# Patient Record
Sex: Female | Born: 1995 | Race: White | Hispanic: No | Marital: Single | Smoking: Never smoker
Health system: Southern US, Community
[De-identification: ages and names within clinical notes are randomized; demographics above are authoritative.]

## PROBLEM LIST (undated history)

## (undated) DIAGNOSIS — R011 Cardiac murmur, unspecified: Secondary | ICD-10-CM

---
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
## 2017-05-27 ENCOUNTER — Emergency Department (HOSPITAL_BASED_OUTPATIENT_CLINIC_OR_DEPARTMENT_OTHER)
Admission: EM | Admit: 2017-05-27 | Discharge: 2017-05-28 | Disposition: A | Discharge status: Home / Self Care | Payer: BLUE CROSS/BLUE SHIELD | Attending: Emergency Medicine | Admitting: Emergency Medicine

## 2017-05-27 ENCOUNTER — Encounter (HOSPITAL_BASED_OUTPATIENT_CLINIC_OR_DEPARTMENT_OTHER): Payer: Self-pay | Admitting: *Deleted

## 2017-05-27 DIAGNOSIS — S39012A Strain of muscle, fascia and tendon of lower back, initial encounter: Secondary | ICD-10-CM | POA: Diagnosis not present

## 2017-05-27 DIAGNOSIS — Y998 Other external cause status: Secondary | ICD-10-CM | POA: Insufficient documentation

## 2017-05-27 DIAGNOSIS — Y92410 Unspecified street and highway as the place of occurrence of the external cause: Secondary | ICD-10-CM | POA: Diagnosis not present

## 2017-05-27 DIAGNOSIS — S30811A Abrasion of abdominal wall, initial encounter: Secondary | ICD-10-CM | POA: Insufficient documentation

## 2017-05-27 DIAGNOSIS — Y9389 Activity, other specified: Secondary | ICD-10-CM | POA: Diagnosis not present

## 2017-05-27 DIAGNOSIS — Z79899 Other long term (current) drug therapy: Secondary | ICD-10-CM | POA: Diagnosis not present

## 2017-05-27 DIAGNOSIS — S20212A Contusion of left front wall of thorax, initial encounter: Secondary | ICD-10-CM

## 2017-05-27 DIAGNOSIS — S60511A Abrasion of right hand, initial encounter: Secondary | ICD-10-CM | POA: Diagnosis not present

## 2017-05-27 DIAGNOSIS — S299XXA Unspecified injury of thorax, initial encounter: Secondary | ICD-10-CM | POA: Diagnosis present

## 2017-05-27 DIAGNOSIS — T07XXXA Unspecified multiple injuries, initial encounter: Secondary | ICD-10-CM

## 2017-05-27 HISTORY — DX: Cardiac murmur, unspecified: R01.1

## 2017-05-27 LAB — PREGNANCY, URINE: Preg Test, Ur: NEGATIVE

## 2017-05-27 MED ORDER — MORPHINE SULFATE (PF) 4 MG/ML IV SOLN
4.0000 mg | Freq: Once | INTRAVENOUS | Status: AC
  Administered 2017-05-28: 4 mg via INTRAVENOUS
  Filled 2017-05-27: qty 1

## 2017-05-27 MED ORDER — SODIUM CHLORIDE 0.9 % IV BOLUS (SEPSIS)
500.0000 mL | Freq: Once | INTRAVENOUS | Status: AC
  Administered 2017-05-28: 500 mL via INTRAVENOUS

## 2017-05-27 MED ORDER — ONDANSETRON HCL 4 MG/2ML IJ SOLN
4.0000 mg | Freq: Once | INTRAMUSCULAR | Status: AC
  Administered 2017-05-28: 4 mg via INTRAVENOUS
  Filled 2017-05-27: qty 2
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
## 2017-05-28 ENCOUNTER — Emergency Department (HOSPITAL_BASED_OUTPATIENT_CLINIC_OR_DEPARTMENT_OTHER): Payer: BLUE CROSS/BLUE SHIELD

## 2017-05-28 DIAGNOSIS — S20212A Contusion of left front wall of thorax, initial encounter: Secondary | ICD-10-CM | POA: Diagnosis not present

## 2017-05-28 LAB — CBC WITH DIFFERENTIAL/PLATELET
BASOS PCT: 0 %
Basophils Absolute: 0 10*3/uL (ref 0.0–0.1)
EOS ABS: 0 10*3/uL (ref 0.0–0.7)
Eosinophils Relative: 0 %
HCT: 41 % (ref 36.0–46.0)
HEMOGLOBIN: 13.8 g/dL (ref 12.0–15.0)
LYMPHS PCT: 8 %
Lymphs Abs: 2.1 10*3/uL (ref 0.7–4.0)
MCH: 30 pg (ref 26.0–34.0)
MCHC: 33.7 g/dL (ref 30.0–36.0)
MCV: 89.1 fL (ref 78.0–100.0)
MONO ABS: 1.8 10*3/uL — AB (ref 0.1–1.0)
Monocytes Relative: 7 %
NEUTROS PCT: 85 %
Neutro Abs: 22 10*3/uL — ABNORMAL HIGH (ref 1.7–7.7)
PLATELETS: 306 10*3/uL (ref 150–400)
RBC: 4.6 MIL/uL (ref 3.87–5.11)
RDW: 12.5 % (ref 11.5–15.5)
WBC: 25.9 10*3/uL — AB (ref 4.0–10.5)

## 2017-05-28 LAB — COMPREHENSIVE METABOLIC PANEL
ALK PHOS: 66 U/L (ref 38–126)
ALT: 32 U/L (ref 14–54)
ANION GAP: 9 (ref 5–15)
AST: 44 U/L — ABNORMAL HIGH (ref 15–41)
Albumin: 3.8 g/dL (ref 3.5–5.0)
BUN: 19 mg/dL (ref 6–20)
CALCIUM: 9.1 mg/dL (ref 8.9–10.3)
CHLORIDE: 104 mmol/L (ref 101–111)
CO2: 24 mmol/L (ref 22–32)
CREATININE: 0.88 mg/dL (ref 0.44–1.00)
Glucose, Bld: 102 mg/dL — ABNORMAL HIGH (ref 65–99)
Potassium: 3.2 mmol/L — ABNORMAL LOW (ref 3.5–5.1)
SODIUM: 137 mmol/L (ref 135–145)
Total Bilirubin: 0.6 mg/dL (ref 0.3–1.2)
Total Protein: 7.2 g/dL (ref 6.5–8.1)

## 2017-05-28 LAB — LIPASE, BLOOD: LIPASE: 33 U/L (ref 11–51)

## 2017-05-28 MED ORDER — HYDROCODONE-ACETAMINOPHEN 5-325 MG PO TABS: 1.0000 | ORAL_TABLET | Freq: Four times a day (QID) | ORAL | 0 refills | Status: AC | PRN

## 2017-05-28 MED ORDER — IBUPROFEN 800 MG PO TABS: 800.0000 mg | ORAL_TABLET | Freq: Three times a day (TID) | ORAL | 0 refills | Status: AC | PRN

## 2017-05-28 MED ORDER — IOPAMIDOL (ISOVUE-300) INJECTION 61%
100.0000 mL | Freq: Once | INTRAVENOUS | Status: AC | PRN
  Administered 2017-05-28: 100 mL via INTRAVENOUS

## 2017-05-28 MED ORDER — CYCLOBENZAPRINE HCL 10 MG PO TABS: 5.0000 mg | ORAL_TABLET | Freq: Two times a day (BID) | ORAL | 0 refills | Status: AC | PRN
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
## 2017-05-28 NOTE — ED Notes (Signed)
IV attempt X2 
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
## 2023-02-24 ENCOUNTER — Ambulatory Visit: Payer: Self-pay | Admitting: Internal Medicine
